# Patient Record
Sex: Male | Born: 1940 | Race: White | Hispanic: No | Marital: Married | Smoking: Never smoker
Health system: Southern US, Community
[De-identification: ages and names within clinical notes are randomized; demographics above are authoritative.]

---

## 2008-05-16 ENCOUNTER — Ambulatory Visit: Payer: Self-pay | Admitting: Gastroenterology

## 2011-03-12 ENCOUNTER — Emergency Department: Payer: Self-pay | Admitting: Emergency Medicine

## 2012-06-16 ENCOUNTER — Ambulatory Visit: Payer: Self-pay | Admitting: Orthopedic Surgery

## 2012-06-20 ENCOUNTER — Ambulatory Visit: Payer: Self-pay | Admitting: Orthopedic Surgery

## 2013-06-19 ENCOUNTER — Ambulatory Visit: Payer: Self-pay | Admitting: Gastroenterology

## 2013-06-20 LAB — PATHOLOGY REPORT

## 2016-07-05 ENCOUNTER — Ambulatory Visit (INDEPENDENT_AMBULATORY_CARE_PROVIDER_SITE_OTHER): Payer: Medicare Other

## 2016-07-05 ENCOUNTER — Ambulatory Visit
Admission: EM | Admit: 2016-07-05 | Discharge: 2016-07-05 | Disposition: A | Discharge status: Home / Self Care | Payer: Medicare Other | Attending: Family Medicine | Admitting: Family Medicine

## 2016-07-05 DIAGNOSIS — R059 Cough, unspecified: Secondary | ICD-10-CM

## 2016-07-05 DIAGNOSIS — R05 Cough: Secondary | ICD-10-CM

## 2016-07-05 DIAGNOSIS — J0100 Acute maxillary sinusitis, unspecified: Secondary | ICD-10-CM | POA: Diagnosis not present

## 2016-07-05 DIAGNOSIS — I4891 Unspecified atrial fibrillation: Secondary | ICD-10-CM

## 2016-07-05 DIAGNOSIS — Z136 Encounter for screening for cardiovascular disorders: Secondary | ICD-10-CM

## 2016-07-05 DIAGNOSIS — I499 Cardiac arrhythmia, unspecified: Secondary | ICD-10-CM | POA: Diagnosis present

## 2016-07-05 MED ORDER — ALBUTEROL SULFATE (2.5 MG/3ML) 0.083% IN NEBU
2.5000 mg | INHALATION_SOLUTION | Freq: Once | RESPIRATORY_TRACT | Status: AC
  Administered 2016-07-05: 2.5 mg via RESPIRATORY_TRACT

## 2016-07-05 MED ORDER — ALBUTEROL SULFATE HFA 108 (90 BASE) MCG/ACT IN AERS: 2.0000 | INHALATION_SPRAY | Freq: Four times a day (QID) | RESPIRATORY_TRACT | 0 refills | Status: AC | PRN

## 2016-07-05 MED ORDER — PREDNISONE 20 MG PO TABS: 40.0000 mg | ORAL_TABLET | Freq: Every day | ORAL | 0 refills | Status: AC

## 2016-07-05 MED ORDER — BENZONATATE 100 MG PO CAPS: 100.0000 mg | ORAL_CAPSULE | Freq: Three times a day (TID) | ORAL | 0 refills | Status: AC | PRN

## 2016-07-05 MED ORDER — DOXYCYCLINE HYCLATE 100 MG PO CAPS: 100.0000 mg | ORAL_CAPSULE | Freq: Two times a day (BID) | ORAL | 0 refills | Status: AC

## 2016-07-05 NOTE — ED Triage Notes (Signed)
Pt c/o nasal congestion and a deep cough since Friday. He says it is keeping him from sleeping. Facial pressure and headache.

## 2016-07-05 NOTE — Discharge Instructions (Signed)
Take medication as prescribed. Rest. Drink plenty of fluids.   Follow up tomorrow with cardiology as scheduled and as discussed. This is very important to keep this appointment.  Follow up with your primary care physician this week as needed. Return to Urgent care or ER for any chest pain, shortness of breath, weakness, palpitations, new or worsening concerns.

## 2016-07-05 NOTE — ED Provider Notes (Signed)
[facility] ____________________________________________  Time seen: Approximately 2:57 PM  I have reviewed the triage vital signs and the nursing notes.   HISTORY  Chief Complaint Sinusitis   HPI Jordan Stephenson is a 76 y.o. male presenting for evaluation of 4-5 days of cough and chest congestion. Patient reports occasional production of greenish mucus. Patient further reports he has had sinus drainage, nasal congestion and some sinus pressure that has been going on for approximately 2 weeks and states that he believes his seasonal allergies causes to start. Reports has not been taken over-the-counter medications except for occasional allergy medicines that have not resolved symptoms. Reports he does have a history of similar presentation due to his allergies. Patient reports that yesterday morning he did wake up with his shirt wet, but denies known fevers. Denies any associated chest pain, shortness of breath or chest pain with deep breath. Reports has continued to remain active and has continued to eat and drink well. Denies known sick contacts.  Denies chest pain, shortness of breath, palpitations, abdominal pain, dysuria, extremity pain, extremity swelling or rash. Denies recent sickness. Denies recent antibiotic use. Denies cardiac history. Denies renal insufficiency history.  FELDPAUSCH, DALE E, MD: PCP    past medical history Hypertension Hyperlipidemia BPH Allergies  There are no active problems to display for this patient.   History reviewed. No pertinent surgical history.   No current facility-administered medications for this encounter.   Current Outpatient Prescriptions:  .  bisoprolol-hydrochlorothiazide (ZIAC) 5-6.25 MG tablet, Take 1 tablet by mouth daily., Disp: , Rfl:  .  finasteride (PROSCAR) 5 MG tablet, Take 5 mg by mouth daily., Disp: , Rfl:  .  gabapentin (NEURONTIN) 300 MG capsule, Take 300 mg by mouth as needed., Disp: , Rfl:  .  gemfibrozil  (LOPID) 600 MG tablet, Take 600 mg by mouth 2 (two) times daily before a meal., Disp: , Rfl:  .  loratadine (CLARITIN) 10 MG tablet, Take 10 mg by mouth daily., Disp: , Rfl:  .  tamsulosin (FLOMAX) 0.4 MG CAPS capsule, Take 0.4 mg by mouth., Disp: , Rfl:  .  albuterol (PROVENTIL HFA;VENTOLIN HFA) 108 (90 Base) MCG/ACT inhaler, Inhale 2 puffs into the lungs every 6 (six) hours as needed for wheezing., Disp: 1 Inhaler, Rfl: 0 .  benzonatate (TESSALON PERLES) 100 MG capsule, Take 1 capsule (100 mg total) by mouth 3 (three) times daily as needed., Disp: 15 capsule, Rfl: 0 .  doxycycline (VIBRAMYCIN) 100 MG capsule, Take 1 capsule (100 mg total) by mouth 2 (two) times daily., Disp: 20 capsule, Rfl: 0 .  predniSONE (DELTASONE) 20 MG tablet, Take 2 tablets (40 mg total) by mouth daily., Disp: 10 tablet, Rfl: 0  Allergies Patient has no known allergies.  family history. Father history of hypertension Denies family cardiac history.   Social History Social History  Substance Use Topics  . Smoking status: Never Smoker  . Smokeless tobacco: Never Used  . Alcohol use No    Review of Systems Constitutional: As above. Eyes: No visual changes. ENT: No sore throat. Cardiovascular: Denies chest pain. Respiratory: Denies shortness of breath. Gastrointestinal: No abdominal pain.  No nausea, no vomiting.  No diarrhea.  No constipation. Genitourinary: Negative for dysuria. Musculoskeletal: Negative for back pain. Skin: Negative for rash.   ____________________________________________   PHYSICAL EXAM:  VITAL SIGNS: ED Triage Vitals  Enc Vitals Group     BP 07/05/16 1355 138/72     Pulse Rate 07/05/16 1355 87  Resp 07/05/16 1355 18     Temp 07/05/16 1355 98.4 F (36.9 C)     Temp Source 07/05/16 1355 Oral     SpO2 07/05/16 1355 97 %     Weight 07/05/16 1356 230 lb (104.3 kg)     Height 07/05/16 1356  (1.753 m)     Head Circumference --      Peak Flow --      Pain Score 07/05/16  1356 1     Pain Loc --      Pain Edu? --      Excl. in GC? --     Constitutional: Alert and oriented. Well appearing and in no acute distress. Eyes: Conjunctivae are normal. PERRL. EOMI. Head: Atraumatic.Mild to moderate tenderness to palpation bilateral maxillary sinuses, nontender frontal sinuses . No swelling. No erythema.   Ears: no erythema, normal TMs bilaterally.   Nose: nasal congestion with bilateral nasal turbinate erythema and edema.   Mouth/Throat: Mucous membranes are moist.  Oropharynx non-erythematous .No tonsillar swelling or exudate.  Posterior nasal drainage present  Neck: No stridor.  No cervical spine tenderness to palpation. Hematological/Lymphatic/Immunilogical: No cervical lymphadenopathy. Cardiovascular: Normal rate. irregular rhythm.  Good peripheral circulation. Respiratory: Normal respiratory effort.  No retractions. Mild scattered inspiratory and expiratory wheezes and mild scattered rhonchi. Rhonchi improves with cough. speaks in complete sentences. Dry intermittent cough noted in room.  Good air movement.  Gastrointestinal: Soft and nontender. No distention.Marland Kitchen No CVA tenderness. Musculoskeletal: Steady gait. No cervical, thoracic or lumbar tenderness to palpation.  bilateral lower extremities nontender no edema noted.  Neurologic:  Normal speech and language. No gait instability. Skin:  Skin is warm, dry Psychiatric: Mood and affect are normal. Speech and behavior are normal.  ___________________________________________   LABS (all labs ordered are listed, but only abnormal results are displayed)  Labs Reviewed - No data to display ____________________________________________  EKG  ED ECG REPORT I, Renford Dills, the attending provider, personally viewed and interpreted this ECG.   Date: 07/05/2016  EKG Time: 1517  Rate: 96  Rhythm: atrial fibrillation, rate 96  Axis: normal  Intervals:none  ST&T Change: no ST or T wave changes noted.  Previous  EKG from 2012 noted, has normal sinus rhythm. EKG visible of dictated report only from the [facility] 2017 noted to be sinus bradycardia. No previous documented history of A. fib noted.  RADIOLOGY   Dg Chest 2 View  Result Date: 07/05/2016 CLINICAL DATA:  Productive cough, wheezing, and congestion. EXAM: CHEST  2 VIEW COMPARISON:  03/12/2011 FINDINGS: The cardiac silhouette remains mildly enlarged. The lungs are well inflated and clear. No pleural effusion or pneumothorax is identified. Thoracic spondylosis is noted. IMPRESSION: No active cardiopulmonary disease. Electronically Signed   By: Sebastian Ache M.D.   On: 07/05/2016 15:50   ____________________________________________   PROCEDURES Procedures    INITIAL IMPRESSION / ASSESSMENT AND PLAN / ED COURSE  Pertinent labs & imaging results that were available during my care of the patient were reviewed by me and considered in my medical decision making (see chart for details).  Very well-appearing patient. No acute distress. Patient smiling and laughing during exam and interaction. Patient patient does have some wheezing and rhonchi noted. Suspect sinusitis secondary to postnasal drainage and cough. Discussed the patient suspect bronchial inflammation and will evaluate by chest x-ray to exclude pneumonia. Albuterol med administered. Patient noted to have a regular heartbeat during exam, patient denies any previous history of this. Recommend EKG, patient agreed to  plan.   Chest x-ray per radiologist no active cardiopulmonary disease. After albuterol neb in urgent care, wheezes fully resolved and patient reports cough improved and feeling better. Will treat patient with oral prednisone, when necessary albuterol inhaler, doxycycline and when necessary Tessalon Perles. EKG reviewed and suspected is A. fib. Discussed with patient suspect incidental finding. Patient appears well, declines any chest pain, shortness breath, palpitations, dizziness, weakness  and appears rate controlled. Called and discussed with cardiologist Dr. Gwen Pounds on call who reviewed EKG which appears as A. fib. Discussed with cardiologist, cardiologist did not recommend any further intervention at this time in urgent care and he will see patient in his office tomorrow at 2:30 PM. Patient and wife at bedside verbalized understanding as well as importance of this follow-up and agrees to keep appointment with cardiologist tomorrow. Discussed in detail with patient for any change in current status, worsening concerns, chest pain, shortness of breath or palpitations to immediately proceed to the emergency room. Encourage close primary care follow-up.Discussed indication, risks and benefits of medications with patient.  Discussed follow up with Primary care physician this week. Discussed follow up and return parameters including no resolution or any worsening concerns. Patient verbalized understanding and agreed to plan.   ____________________________________________   FINAL CLINICAL IMPRESSION(S) / ED DIAGNOSES  Final diagnoses:  Acute maxillary sinusitis, recurrence not specified  Cough  Atrial fibrillation, unspecified type [facility])     Discharge Medication List as of 07/05/2016  4:16 PM    START taking these medications   Details  albuterol (PROVENTIL HFA;VENTOLIN HFA) 108 (90 Base) MCG/ACT inhaler Inhale 2 puffs into the lungs every 6 (six) hours as needed for wheezing., Starting Mon 07/05/2016, Normal    benzonatate (TESSALON PERLES) 100 MG capsule Take 1 capsule (100 mg total) by mouth 3 (three) times daily as needed., Starting Mon 07/05/2016, Normal    doxycycline (VIBRAMYCIN) 100 MG capsule Take 1 capsule (100 mg total) by mouth 2 (two) times daily., Starting Mon 07/05/2016, Normal    predniSONE (DELTASONE) 20 MG tablet Take 2 tablets (40 mg total) by mouth daily., Starting Mon 07/05/2016, Normal        Note: This dictation was prepared with Dragon dictation along with  smaller phrase technology. Any transcriptional errors that result from this process are unintentional.         Renford Dills, NP 07/05/16 1953

## 2017-12-11 IMAGING — CR DG CHEST 2V
2 series · 2 of 2 positions shown · non-contrast
Comparison: 03/12/2011

CLINICAL DATA: Productive cough, wheezing, and congestion.

EXAM:
CHEST  2 VIEW

[chest pa]
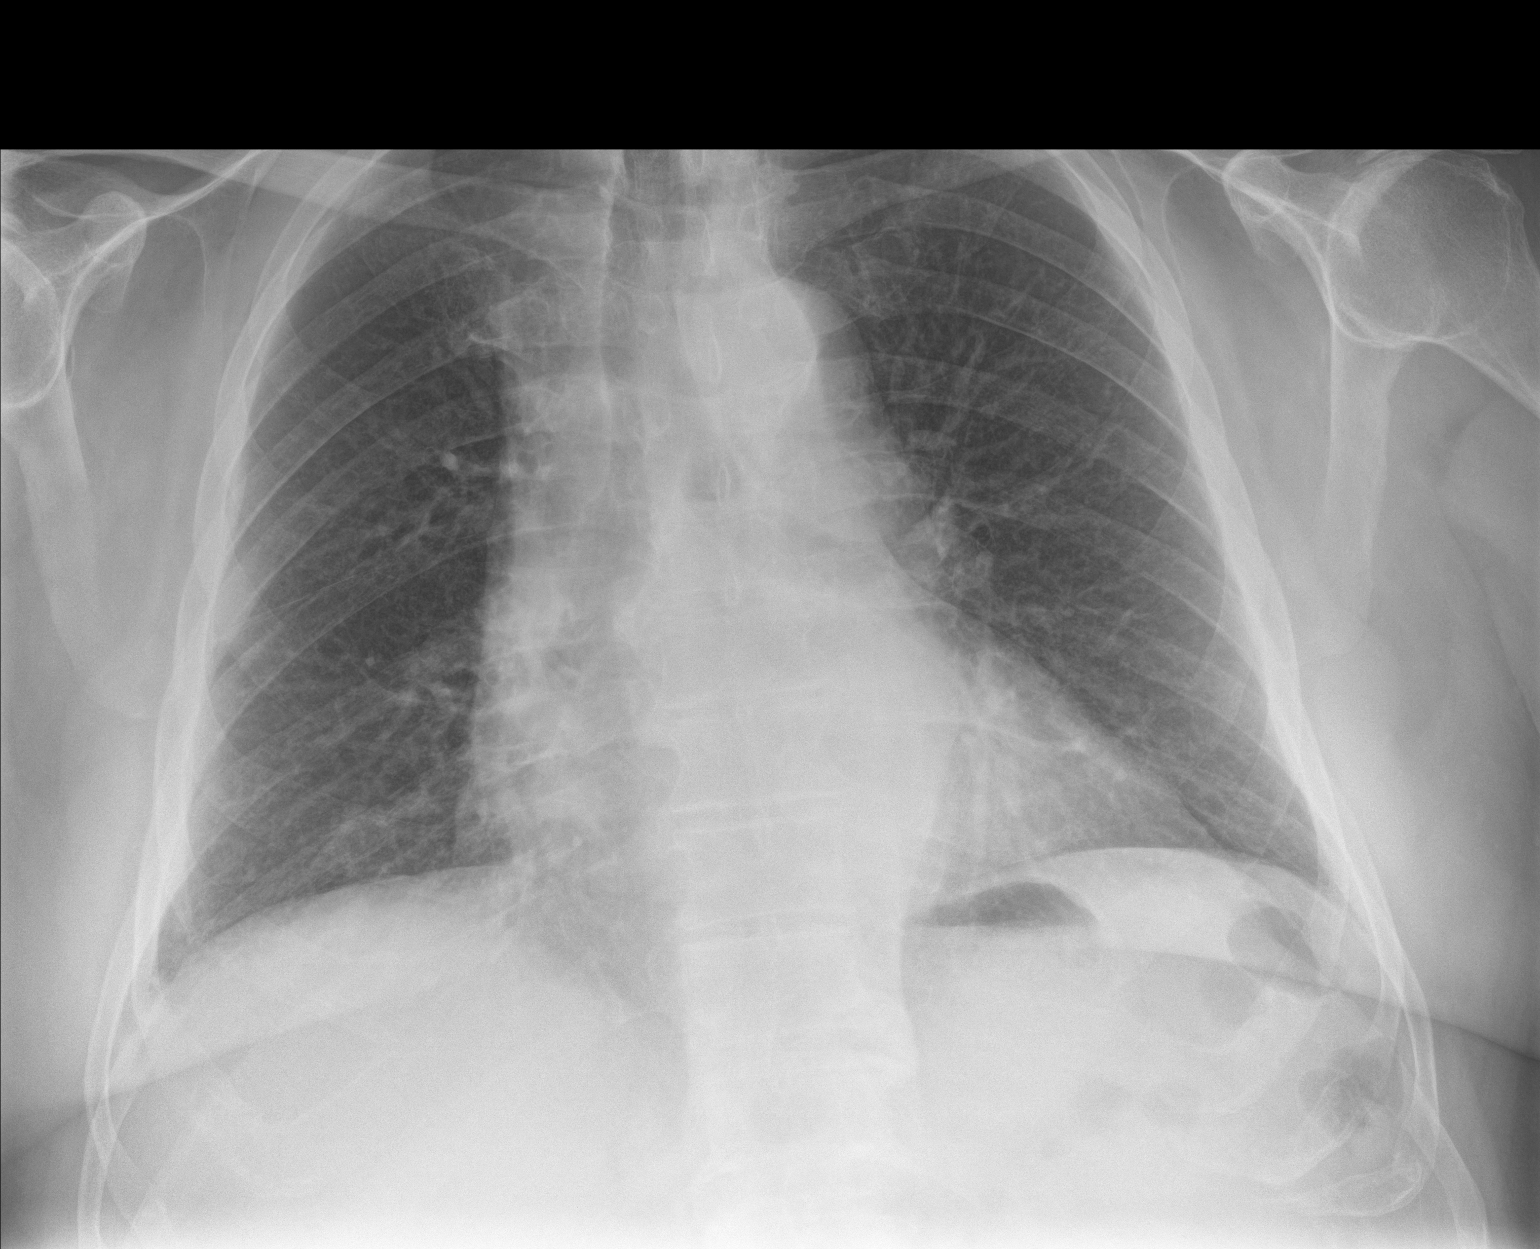

[chest lat]
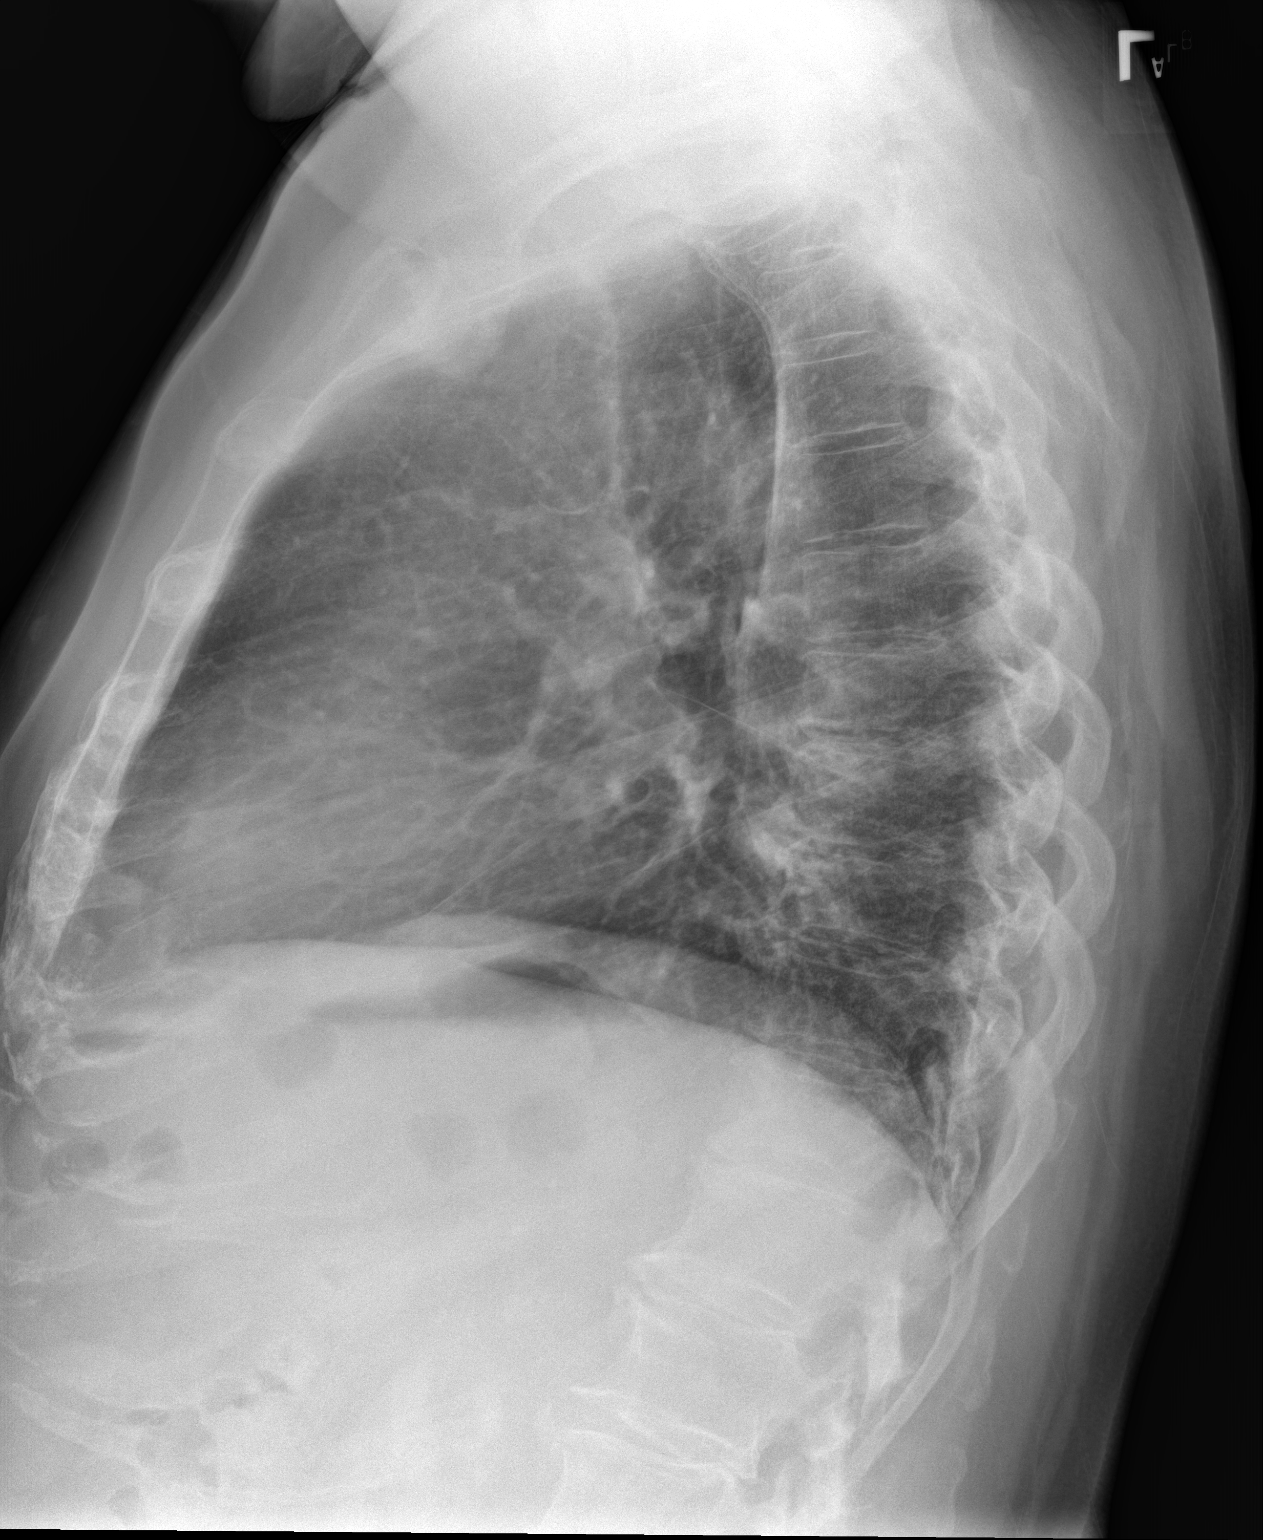

[2 of 2 positions shown; findings below may reference images not displayed]

FINDINGS: The cardiac silhouette remains mildly enlarged. The lungs are well
inflated and clear. No pleural effusion or pneumothorax is
identified. Thoracic spondylosis is noted.
IMPRESSION: No active cardiopulmonary disease.

## 2018-10-02 ENCOUNTER — Other Ambulatory Visit: Admission: RE | Admit: 2018-10-02 | Payer: Medicare Other | Source: Ambulatory Visit

## 2018-10-05 ENCOUNTER — Ambulatory Visit: Admit: 2018-10-05 | Payer: Medicare Other | Admitting: Gastroenterology

## 2018-10-05 SURGERY — COLONOSCOPY WITH PROPOFOL
Anesthesia: General

## 2021-08-14 ENCOUNTER — Ambulatory Visit: Admission: RE | Admit: 2021-08-14 | Payer: Medicare Other | Source: Home / Self Care

## 2021-08-14 ENCOUNTER — Encounter: Admission: RE | Payer: Self-pay | Source: Home / Self Care

## 2021-08-14 SURGERY — COLONOSCOPY WITH PROPOFOL
Anesthesia: General

## 2021-09-24 DIAGNOSIS — M5416 Radiculopathy, lumbar region: Secondary | ICD-10-CM | POA: Diagnosis not present

## 2021-09-24 DIAGNOSIS — M5116 Intervertebral disc disorders with radiculopathy, lumbar region: Secondary | ICD-10-CM | POA: Diagnosis not present

## 2021-09-24 DIAGNOSIS — Z01818 Encounter for other preprocedural examination: Secondary | ICD-10-CM | POA: Diagnosis not present

## 2021-09-24 DIAGNOSIS — M48061 Spinal stenosis, lumbar region without neurogenic claudication: Secondary | ICD-10-CM | POA: Diagnosis not present

## 2021-09-24 DIAGNOSIS — M4316 Spondylolisthesis, lumbar region: Secondary | ICD-10-CM | POA: Diagnosis not present

## 2021-09-24 DIAGNOSIS — I10 Essential (primary) hypertension: Secondary | ICD-10-CM | POA: Diagnosis not present

## 2021-09-24 DIAGNOSIS — M47816 Spondylosis without myelopathy or radiculopathy, lumbar region: Secondary | ICD-10-CM | POA: Diagnosis not present

## 2021-09-24 DIAGNOSIS — M48062 Spinal stenosis, lumbar region with neurogenic claudication: Secondary | ICD-10-CM | POA: Diagnosis not present

## 2021-09-24 DIAGNOSIS — I4891 Unspecified atrial fibrillation: Secondary | ICD-10-CM | POA: Diagnosis not present

## 2021-10-09 DIAGNOSIS — Z7901 Long term (current) use of anticoagulants: Secondary | ICD-10-CM | POA: Diagnosis not present

## 2021-10-09 DIAGNOSIS — M5116 Intervertebral disc disorders with radiculopathy, lumbar region: Secondary | ICD-10-CM | POA: Diagnosis not present

## 2021-10-09 DIAGNOSIS — M5136 Other intervertebral disc degeneration, lumbar region: Secondary | ICD-10-CM | POA: Diagnosis not present

## 2021-10-09 DIAGNOSIS — E7800 Pure hypercholesterolemia, unspecified: Secondary | ICD-10-CM | POA: Diagnosis not present

## 2021-10-09 DIAGNOSIS — M48062 Spinal stenosis, lumbar region with neurogenic claudication: Secondary | ICD-10-CM | POA: Diagnosis not present

## 2021-10-09 DIAGNOSIS — Z981 Arthrodesis status: Secondary | ICD-10-CM | POA: Diagnosis not present

## 2021-10-09 DIAGNOSIS — I10 Essential (primary) hypertension: Secondary | ICD-10-CM | POA: Diagnosis not present

## 2021-10-09 DIAGNOSIS — N400 Enlarged prostate without lower urinary tract symptoms: Secondary | ICD-10-CM | POA: Diagnosis not present

## 2021-10-09 DIAGNOSIS — M4317 Spondylolisthesis, lumbosacral region: Secondary | ICD-10-CM | POA: Diagnosis not present

## 2021-10-09 DIAGNOSIS — M47816 Spondylosis without myelopathy or radiculopathy, lumbar region: Secondary | ICD-10-CM | POA: Diagnosis not present

## 2021-10-09 DIAGNOSIS — I4891 Unspecified atrial fibrillation: Secondary | ICD-10-CM | POA: Diagnosis not present

## 2021-10-09 DIAGNOSIS — M4726 Other spondylosis with radiculopathy, lumbar region: Secondary | ICD-10-CM | POA: Diagnosis not present

## 2021-10-09 DIAGNOSIS — N138 Other obstructive and reflux uropathy: Secondary | ICD-10-CM | POA: Diagnosis not present

## 2021-10-09 DIAGNOSIS — E785 Hyperlipidemia, unspecified: Secondary | ICD-10-CM | POA: Diagnosis not present

## 2021-10-19 DIAGNOSIS — E7800 Pure hypercholesterolemia, unspecified: Secondary | ICD-10-CM | POA: Diagnosis not present

## 2021-10-19 DIAGNOSIS — M48061 Spinal stenosis, lumbar region without neurogenic claudication: Secondary | ICD-10-CM | POA: Diagnosis not present

## 2021-10-19 DIAGNOSIS — Z4789 Encounter for other orthopedic aftercare: Secondary | ICD-10-CM | POA: Diagnosis not present

## 2021-10-19 DIAGNOSIS — I480 Paroxysmal atrial fibrillation: Secondary | ICD-10-CM | POA: Diagnosis not present

## 2021-10-19 DIAGNOSIS — J989 Respiratory disorder, unspecified: Secondary | ICD-10-CM | POA: Diagnosis not present

## 2021-10-19 DIAGNOSIS — N401 Enlarged prostate with lower urinary tract symptoms: Secondary | ICD-10-CM | POA: Diagnosis not present

## 2021-10-19 DIAGNOSIS — N138 Other obstructive and reflux uropathy: Secondary | ICD-10-CM | POA: Diagnosis not present

## 2021-10-19 DIAGNOSIS — R7303 Prediabetes: Secondary | ICD-10-CM | POA: Diagnosis not present

## 2021-10-19 DIAGNOSIS — I10 Essential (primary) hypertension: Secondary | ICD-10-CM | POA: Diagnosis not present

## 2021-10-29 DIAGNOSIS — M5416 Radiculopathy, lumbar region: Secondary | ICD-10-CM | POA: Diagnosis not present

## 2021-10-29 DIAGNOSIS — Z981 Arthrodesis status: Secondary | ICD-10-CM | POA: Diagnosis not present

## 2021-10-29 DIAGNOSIS — M5116 Intervertebral disc disorders with radiculopathy, lumbar region: Secondary | ICD-10-CM | POA: Diagnosis not present

## 2021-10-29 DIAGNOSIS — M4726 Other spondylosis with radiculopathy, lumbar region: Secondary | ICD-10-CM | POA: Diagnosis not present

## 2021-10-29 DIAGNOSIS — M47816 Spondylosis without myelopathy or radiculopathy, lumbar region: Secondary | ICD-10-CM | POA: Diagnosis not present

## 2021-10-29 DIAGNOSIS — M48062 Spinal stenosis, lumbar region with neurogenic claudication: Secondary | ICD-10-CM | POA: Diagnosis not present

## 2021-11-11 ENCOUNTER — Ambulatory Visit: Admit: 2021-11-11 | Payer: Self-pay | Admitting: Internal Medicine

## 2021-11-11 SURGERY — COLONOSCOPY WITH PROPOFOL
Anesthesia: General

## 2021-11-26 DIAGNOSIS — M5136 Other intervertebral disc degeneration, lumbar region: Secondary | ICD-10-CM | POA: Diagnosis not present

## 2021-11-26 DIAGNOSIS — M47816 Spondylosis without myelopathy or radiculopathy, lumbar region: Secondary | ICD-10-CM | POA: Diagnosis not present

## 2021-12-04 DIAGNOSIS — H524 Presbyopia: Secondary | ICD-10-CM | POA: Diagnosis not present

## 2021-12-10 DIAGNOSIS — E7800 Pure hypercholesterolemia, unspecified: Secondary | ICD-10-CM | POA: Diagnosis not present

## 2021-12-10 DIAGNOSIS — M5441 Lumbago with sciatica, right side: Secondary | ICD-10-CM | POA: Diagnosis not present

## 2021-12-10 DIAGNOSIS — E669 Obesity, unspecified: Secondary | ICD-10-CM | POA: Diagnosis not present

## 2021-12-10 DIAGNOSIS — J301 Allergic rhinitis due to pollen: Secondary | ICD-10-CM | POA: Diagnosis not present

## 2021-12-10 DIAGNOSIS — I480 Paroxysmal atrial fibrillation: Secondary | ICD-10-CM | POA: Diagnosis not present

## 2021-12-10 DIAGNOSIS — I10 Essential (primary) hypertension: Secondary | ICD-10-CM | POA: Diagnosis not present

## 2021-12-10 DIAGNOSIS — E119 Type 2 diabetes mellitus without complications: Secondary | ICD-10-CM | POA: Diagnosis not present

## 2021-12-10 DIAGNOSIS — G8929 Other chronic pain: Secondary | ICD-10-CM | POA: Diagnosis not present

## 2021-12-10 DIAGNOSIS — N401 Enlarged prostate with lower urinary tract symptoms: Secondary | ICD-10-CM | POA: Diagnosis not present

## 2021-12-11 DIAGNOSIS — E7800 Pure hypercholesterolemia, unspecified: Secondary | ICD-10-CM | POA: Diagnosis not present

## 2021-12-11 DIAGNOSIS — E119 Type 2 diabetes mellitus without complications: Secondary | ICD-10-CM | POA: Diagnosis not present

## 2021-12-18 DIAGNOSIS — H2513 Age-related nuclear cataract, bilateral: Secondary | ICD-10-CM | POA: Diagnosis not present

## 2021-12-18 DIAGNOSIS — H40003 Preglaucoma, unspecified, bilateral: Secondary | ICD-10-CM | POA: Diagnosis not present

## 2022-01-05 DIAGNOSIS — Z4889 Encounter for other specified surgical aftercare: Secondary | ICD-10-CM | POA: Diagnosis not present

## 2022-01-05 DIAGNOSIS — M47816 Spondylosis without myelopathy or radiculopathy, lumbar region: Secondary | ICD-10-CM | POA: Diagnosis not present

## 2022-01-07 DIAGNOSIS — E7800 Pure hypercholesterolemia, unspecified: Secondary | ICD-10-CM | POA: Diagnosis not present

## 2022-01-07 DIAGNOSIS — N138 Other obstructive and reflux uropathy: Secondary | ICD-10-CM | POA: Diagnosis not present

## 2022-01-07 DIAGNOSIS — Z885 Allergy status to narcotic agent status: Secondary | ICD-10-CM | POA: Diagnosis not present

## 2022-01-07 DIAGNOSIS — N401 Enlarged prostate with lower urinary tract symptoms: Secondary | ICD-10-CM | POA: Diagnosis not present

## 2022-01-07 DIAGNOSIS — Z7901 Long term (current) use of anticoagulants: Secondary | ICD-10-CM | POA: Diagnosis not present

## 2022-01-07 DIAGNOSIS — I10 Essential (primary) hypertension: Secondary | ICD-10-CM | POA: Diagnosis not present

## 2022-01-07 DIAGNOSIS — Z79899 Other long term (current) drug therapy: Secondary | ICD-10-CM | POA: Diagnosis not present

## 2022-02-01 DIAGNOSIS — D229 Melanocytic nevi, unspecified: Secondary | ICD-10-CM | POA: Diagnosis not present

## 2022-02-01 DIAGNOSIS — L570 Actinic keratosis: Secondary | ICD-10-CM | POA: Diagnosis not present

## 2022-02-01 DIAGNOSIS — L578 Other skin changes due to chronic exposure to nonionizing radiation: Secondary | ICD-10-CM | POA: Diagnosis not present

## 2022-02-01 DIAGNOSIS — L820 Inflamed seborrheic keratosis: Secondary | ICD-10-CM | POA: Diagnosis not present

## 2022-02-01 DIAGNOSIS — L814 Other melanin hyperpigmentation: Secondary | ICD-10-CM | POA: Diagnosis not present

## 2022-02-02 DIAGNOSIS — H40003 Preglaucoma, unspecified, bilateral: Secondary | ICD-10-CM | POA: Diagnosis not present

## 2022-02-02 DIAGNOSIS — H2513 Age-related nuclear cataract, bilateral: Secondary | ICD-10-CM | POA: Diagnosis not present

## 2022-04-06 DIAGNOSIS — M79604 Pain in right leg: Secondary | ICD-10-CM | POA: Diagnosis not present

## 2022-04-06 DIAGNOSIS — M47816 Spondylosis without myelopathy or radiculopathy, lumbar region: Secondary | ICD-10-CM | POA: Diagnosis not present

## 2022-04-06 DIAGNOSIS — I10 Essential (primary) hypertension: Secondary | ICD-10-CM | POA: Diagnosis not present

## 2022-04-06 DIAGNOSIS — M170 Bilateral primary osteoarthritis of knee: Secondary | ICD-10-CM | POA: Diagnosis not present

## 2022-04-06 DIAGNOSIS — I4891 Unspecified atrial fibrillation: Secondary | ICD-10-CM | POA: Diagnosis not present

## 2022-04-06 DIAGNOSIS — Z4789 Encounter for other orthopedic aftercare: Secondary | ICD-10-CM | POA: Diagnosis not present

## 2022-04-06 DIAGNOSIS — M48061 Spinal stenosis, lumbar region without neurogenic claudication: Secondary | ICD-10-CM | POA: Diagnosis not present

## 2022-04-06 DIAGNOSIS — M4317 Spondylolisthesis, lumbosacral region: Secondary | ICD-10-CM | POA: Diagnosis not present

## 2022-04-06 DIAGNOSIS — M5416 Radiculopathy, lumbar region: Secondary | ICD-10-CM | POA: Diagnosis not present

## 2022-04-06 DIAGNOSIS — M4316 Spondylolisthesis, lumbar region: Secondary | ICD-10-CM | POA: Diagnosis not present

## 2022-04-06 DIAGNOSIS — I4819 Other persistent atrial fibrillation: Secondary | ICD-10-CM | POA: Diagnosis not present

## 2022-04-06 DIAGNOSIS — Z981 Arthrodesis status: Secondary | ICD-10-CM | POA: Diagnosis not present

## 2022-04-06 DIAGNOSIS — M7989 Other specified soft tissue disorders: Secondary | ICD-10-CM | POA: Diagnosis not present

## 2022-04-12 DIAGNOSIS — M25562 Pain in left knee: Secondary | ICD-10-CM | POA: Diagnosis not present

## 2022-04-15 DIAGNOSIS — M170 Bilateral primary osteoarthritis of knee: Secondary | ICD-10-CM | POA: Diagnosis not present

## 2022-04-15 DIAGNOSIS — S8002XA Contusion of left knee, initial encounter: Secondary | ICD-10-CM | POA: Diagnosis not present

## 2022-05-10 DIAGNOSIS — M1712 Unilateral primary osteoarthritis, left knee: Secondary | ICD-10-CM | POA: Diagnosis not present

## 2022-06-09 DIAGNOSIS — E7800 Pure hypercholesterolemia, unspecified: Secondary | ICD-10-CM | POA: Diagnosis not present

## 2022-06-09 DIAGNOSIS — E119 Type 2 diabetes mellitus without complications: Secondary | ICD-10-CM | POA: Diagnosis not present

## 2022-06-09 DIAGNOSIS — I10 Essential (primary) hypertension: Secondary | ICD-10-CM | POA: Diagnosis not present

## 2022-06-09 DIAGNOSIS — I480 Paroxysmal atrial fibrillation: Secondary | ICD-10-CM | POA: Diagnosis not present

## 2022-06-18 DIAGNOSIS — H9190 Unspecified hearing loss, unspecified ear: Secondary | ICD-10-CM | POA: Diagnosis not present

## 2022-06-29 DIAGNOSIS — H2513 Age-related nuclear cataract, bilateral: Secondary | ICD-10-CM | POA: Diagnosis not present

## 2022-07-06 DIAGNOSIS — M47816 Spondylosis without myelopathy or radiculopathy, lumbar region: Secondary | ICD-10-CM | POA: Diagnosis not present

## 2022-07-06 DIAGNOSIS — I4891 Unspecified atrial fibrillation: Secondary | ICD-10-CM | POA: Diagnosis not present

## 2022-07-06 DIAGNOSIS — Z7901 Long term (current) use of anticoagulants: Secondary | ICD-10-CM | POA: Diagnosis not present

## 2022-07-06 DIAGNOSIS — Z981 Arthrodesis status: Secondary | ICD-10-CM | POA: Diagnosis not present

## 2022-07-06 DIAGNOSIS — I10 Essential (primary) hypertension: Secondary | ICD-10-CM | POA: Diagnosis not present

## 2022-07-06 DIAGNOSIS — M5136 Other intervertebral disc degeneration, lumbar region: Secondary | ICD-10-CM | POA: Diagnosis not present

## 2022-07-19 DIAGNOSIS — L570 Actinic keratosis: Secondary | ICD-10-CM | POA: Diagnosis not present

## 2022-07-19 DIAGNOSIS — L578 Other skin changes due to chronic exposure to nonionizing radiation: Secondary | ICD-10-CM | POA: Diagnosis not present

## 2022-07-19 DIAGNOSIS — L814 Other melanin hyperpigmentation: Secondary | ICD-10-CM | POA: Diagnosis not present

## 2022-07-19 DIAGNOSIS — Z85828 Personal history of other malignant neoplasm of skin: Secondary | ICD-10-CM | POA: Diagnosis not present

## 2022-07-19 DIAGNOSIS — D229 Melanocytic nevi, unspecified: Secondary | ICD-10-CM | POA: Diagnosis not present

## 2022-07-20 DIAGNOSIS — E119 Type 2 diabetes mellitus without complications: Secondary | ICD-10-CM | POA: Diagnosis not present

## 2022-07-28 DIAGNOSIS — I10 Essential (primary) hypertension: Secondary | ICD-10-CM | POA: Diagnosis not present

## 2022-07-28 DIAGNOSIS — N401 Enlarged prostate with lower urinary tract symptoms: Secondary | ICD-10-CM | POA: Diagnosis not present

## 2022-07-28 DIAGNOSIS — Z0000 Encounter for general adult medical examination without abnormal findings: Secondary | ICD-10-CM | POA: Diagnosis not present

## 2022-07-28 DIAGNOSIS — J301 Allergic rhinitis due to pollen: Secondary | ICD-10-CM | POA: Diagnosis not present

## 2022-07-28 DIAGNOSIS — E7800 Pure hypercholesterolemia, unspecified: Secondary | ICD-10-CM | POA: Diagnosis not present

## 2022-07-28 DIAGNOSIS — Z1331 Encounter for screening for depression: Secondary | ICD-10-CM | POA: Diagnosis not present

## 2022-07-28 DIAGNOSIS — M5442 Lumbago with sciatica, left side: Secondary | ICD-10-CM | POA: Diagnosis not present

## 2022-07-28 DIAGNOSIS — I480 Paroxysmal atrial fibrillation: Secondary | ICD-10-CM | POA: Diagnosis not present

## 2022-07-28 DIAGNOSIS — E119 Type 2 diabetes mellitus without complications: Secondary | ICD-10-CM | POA: Diagnosis not present

## 2022-07-29 DIAGNOSIS — Z9889 Other specified postprocedural states: Secondary | ICD-10-CM | POA: Diagnosis not present

## 2022-07-29 DIAGNOSIS — M5136 Other intervertebral disc degeneration, lumbar region: Secondary | ICD-10-CM | POA: Diagnosis not present

## 2022-07-29 DIAGNOSIS — M47816 Spondylosis without myelopathy or radiculopathy, lumbar region: Secondary | ICD-10-CM | POA: Diagnosis not present

## 2022-07-29 DIAGNOSIS — Z981 Arthrodesis status: Secondary | ICD-10-CM | POA: Diagnosis not present

## 2022-07-29 DIAGNOSIS — M48061 Spinal stenosis, lumbar region without neurogenic claudication: Secondary | ICD-10-CM | POA: Diagnosis not present

## 2022-08-03 DIAGNOSIS — M47816 Spondylosis without myelopathy or radiculopathy, lumbar region: Secondary | ICD-10-CM | POA: Diagnosis not present

## 2022-09-01 DIAGNOSIS — H903 Sensorineural hearing loss, bilateral: Secondary | ICD-10-CM | POA: Diagnosis not present

## 2022-09-10 DIAGNOSIS — M47816 Spondylosis without myelopathy or radiculopathy, lumbar region: Secondary | ICD-10-CM | POA: Diagnosis not present

## 2022-09-10 DIAGNOSIS — M5416 Radiculopathy, lumbar region: Secondary | ICD-10-CM | POA: Diagnosis not present

## 2022-09-20 DIAGNOSIS — M5416 Radiculopathy, lumbar region: Secondary | ICD-10-CM | POA: Diagnosis not present

## 2022-09-20 DIAGNOSIS — M4726 Other spondylosis with radiculopathy, lumbar region: Secondary | ICD-10-CM | POA: Diagnosis not present

## 2022-09-20 DIAGNOSIS — Z91041 Radiographic dye allergy status: Secondary | ICD-10-CM | POA: Diagnosis not present

## 2022-09-20 DIAGNOSIS — M5116 Intervertebral disc disorders with radiculopathy, lumbar region: Secondary | ICD-10-CM | POA: Diagnosis not present

## 2022-09-20 DIAGNOSIS — M48061 Spinal stenosis, lumbar region without neurogenic claudication: Secondary | ICD-10-CM | POA: Diagnosis not present

## 2022-09-30 DIAGNOSIS — M5416 Radiculopathy, lumbar region: Secondary | ICD-10-CM | POA: Diagnosis not present

## 2022-11-02 DIAGNOSIS — M545 Low back pain, unspecified: Secondary | ICD-10-CM | POA: Diagnosis not present

## 2022-11-02 DIAGNOSIS — M47816 Spondylosis without myelopathy or radiculopathy, lumbar region: Secondary | ICD-10-CM | POA: Diagnosis not present

## 2022-11-02 DIAGNOSIS — Z7901 Long term (current) use of anticoagulants: Secondary | ICD-10-CM | POA: Diagnosis not present

## 2022-11-02 DIAGNOSIS — Z981 Arthrodesis status: Secondary | ICD-10-CM | POA: Diagnosis not present

## 2022-11-02 DIAGNOSIS — M4316 Spondylolisthesis, lumbar region: Secondary | ICD-10-CM | POA: Diagnosis not present

## 2022-11-02 DIAGNOSIS — M5126 Other intervertebral disc displacement, lumbar region: Secondary | ICD-10-CM | POA: Diagnosis not present

## 2022-11-02 DIAGNOSIS — I4891 Unspecified atrial fibrillation: Secondary | ICD-10-CM | POA: Diagnosis not present

## 2023-06-17 DIAGNOSIS — G9340 Encephalopathy, unspecified: Secondary | ICD-10-CM | POA: Diagnosis not present

## 2023-06-17 DIAGNOSIS — F32A Depression, unspecified: Secondary | ICD-10-CM | POA: Diagnosis not present

## 2023-06-17 DIAGNOSIS — E785 Hyperlipidemia, unspecified: Secondary | ICD-10-CM | POA: Diagnosis not present

## 2023-06-17 DIAGNOSIS — Z7901 Long term (current) use of anticoagulants: Secondary | ICD-10-CM | POA: Diagnosis not present

## 2023-06-17 DIAGNOSIS — R410 Disorientation, unspecified: Secondary | ICD-10-CM | POA: Diagnosis not present

## 2023-06-17 DIAGNOSIS — R4189 Other symptoms and signs involving cognitive functions and awareness: Secondary | ICD-10-CM | POA: Diagnosis not present

## 2023-06-17 DIAGNOSIS — F419 Anxiety disorder, unspecified: Secondary | ICD-10-CM | POA: Diagnosis not present

## 2023-06-17 DIAGNOSIS — R29898 Other symptoms and signs involving the musculoskeletal system: Secondary | ICD-10-CM | POA: Diagnosis not present

## 2023-06-17 DIAGNOSIS — H903 Sensorineural hearing loss, bilateral: Secondary | ICD-10-CM | POA: Diagnosis not present

## 2023-06-17 DIAGNOSIS — I10 Essential (primary) hypertension: Secondary | ICD-10-CM | POA: Diagnosis not present

## 2023-08-04 DIAGNOSIS — N401 Enlarged prostate with lower urinary tract symptoms: Secondary | ICD-10-CM | POA: Diagnosis not present

## 2023-08-04 DIAGNOSIS — I480 Paroxysmal atrial fibrillation: Secondary | ICD-10-CM | POA: Diagnosis not present

## 2023-08-04 DIAGNOSIS — R7302 Impaired glucose tolerance (oral): Secondary | ICD-10-CM | POA: Diagnosis not present

## 2023-08-04 DIAGNOSIS — I10 Essential (primary) hypertension: Secondary | ICD-10-CM | POA: Diagnosis not present

## 2023-08-04 DIAGNOSIS — Z0000 Encounter for general adult medical examination without abnormal findings: Secondary | ICD-10-CM | POA: Diagnosis not present

## 2023-08-04 DIAGNOSIS — E538 Deficiency of other specified B group vitamins: Secondary | ICD-10-CM | POA: Diagnosis not present

## 2023-08-04 DIAGNOSIS — J301 Allergic rhinitis due to pollen: Secondary | ICD-10-CM | POA: Diagnosis not present

## 2023-08-04 DIAGNOSIS — F320 Major depressive disorder, single episode, mild: Secondary | ICD-10-CM | POA: Diagnosis not present

## 2023-08-04 DIAGNOSIS — Z1331 Encounter for screening for depression: Secondary | ICD-10-CM | POA: Diagnosis not present

## 2023-08-04 DIAGNOSIS — E7800 Pure hypercholesterolemia, unspecified: Secondary | ICD-10-CM | POA: Diagnosis not present

## 2023-08-30 DIAGNOSIS — H40003 Preglaucoma, unspecified, bilateral: Secondary | ICD-10-CM | POA: Diagnosis not present

## 2023-08-30 DIAGNOSIS — H2513 Age-related nuclear cataract, bilateral: Secondary | ICD-10-CM | POA: Diagnosis not present

## 2023-09-06 DIAGNOSIS — I4891 Unspecified atrial fibrillation: Secondary | ICD-10-CM | POA: Diagnosis not present

## 2023-09-06 DIAGNOSIS — I10 Essential (primary) hypertension: Secondary | ICD-10-CM | POA: Diagnosis not present

## 2023-09-06 DIAGNOSIS — I4819 Other persistent atrial fibrillation: Secondary | ICD-10-CM | POA: Diagnosis not present

## 2023-09-22 DIAGNOSIS — I4819 Other persistent atrial fibrillation: Secondary | ICD-10-CM | POA: Diagnosis not present

## 2023-10-10 DIAGNOSIS — F321 Major depressive disorder, single episode, moderate: Secondary | ICD-10-CM | POA: Diagnosis not present

## 2023-10-10 DIAGNOSIS — R4189 Other symptoms and signs involving cognitive functions and awareness: Secondary | ICD-10-CM | POA: Diagnosis not present

## 2023-10-10 DIAGNOSIS — D519 Vitamin B12 deficiency anemia, unspecified: Secondary | ICD-10-CM | POA: Diagnosis not present

## 2023-10-10 DIAGNOSIS — G3184 Mild cognitive impairment, so stated: Secondary | ICD-10-CM | POA: Diagnosis not present

## 2023-10-19 DIAGNOSIS — E7800 Pure hypercholesterolemia, unspecified: Secondary | ICD-10-CM | POA: Diagnosis not present

## 2023-10-19 DIAGNOSIS — Z125 Encounter for screening for malignant neoplasm of prostate: Secondary | ICD-10-CM | POA: Diagnosis not present

## 2023-10-19 DIAGNOSIS — N3941 Urge incontinence: Secondary | ICD-10-CM | POA: Diagnosis not present

## 2023-10-19 DIAGNOSIS — I10 Essential (primary) hypertension: Secondary | ICD-10-CM | POA: Diagnosis not present

## 2023-10-19 DIAGNOSIS — Z885 Allergy status to narcotic agent status: Secondary | ICD-10-CM | POA: Diagnosis not present

## 2023-10-19 DIAGNOSIS — K5900 Constipation, unspecified: Secondary | ICD-10-CM | POA: Diagnosis not present

## 2023-10-19 DIAGNOSIS — R413 Other amnesia: Secondary | ICD-10-CM | POA: Diagnosis not present

## 2023-10-19 DIAGNOSIS — N138 Other obstructive and reflux uropathy: Secondary | ICD-10-CM | POA: Diagnosis not present

## 2023-10-19 DIAGNOSIS — N401 Enlarged prostate with lower urinary tract symptoms: Secondary | ICD-10-CM | POA: Diagnosis not present

## 2023-10-19 DIAGNOSIS — D510 Vitamin B12 deficiency anemia due to intrinsic factor deficiency: Secondary | ICD-10-CM | POA: Diagnosis not present

## 2023-10-24 DIAGNOSIS — G9340 Encephalopathy, unspecified: Secondary | ICD-10-CM | POA: Diagnosis not present

## 2023-10-24 DIAGNOSIS — M549 Dorsalgia, unspecified: Secondary | ICD-10-CM | POA: Diagnosis not present

## 2023-10-24 DIAGNOSIS — G8929 Other chronic pain: Secondary | ICD-10-CM | POA: Diagnosis not present

## 2023-10-24 DIAGNOSIS — F329 Major depressive disorder, single episode, unspecified: Secondary | ICD-10-CM | POA: Diagnosis not present

## 2023-10-24 DIAGNOSIS — Z9889 Other specified postprocedural states: Secondary | ICD-10-CM | POA: Diagnosis not present

## 2023-10-24 DIAGNOSIS — R2689 Other abnormalities of gait and mobility: Secondary | ICD-10-CM | POA: Diagnosis not present

## 2023-10-24 DIAGNOSIS — G3184 Mild cognitive impairment, so stated: Secondary | ICD-10-CM | POA: Diagnosis not present

## 2023-10-24 DIAGNOSIS — R260 Ataxic gait: Secondary | ICD-10-CM | POA: Diagnosis not present

## 2023-10-24 DIAGNOSIS — G3183 Dementia with Lewy bodies: Secondary | ICD-10-CM | POA: Diagnosis not present

## 2023-10-24 DIAGNOSIS — G20C Parkinsonism, unspecified: Secondary | ICD-10-CM | POA: Diagnosis not present

## 2023-10-24 DIAGNOSIS — R2681 Unsteadiness on feet: Secondary | ICD-10-CM | POA: Diagnosis not present

## 2023-10-24 DIAGNOSIS — F0280 Dementia in other diseases classified elsewhere without behavioral disturbance: Secondary | ICD-10-CM | POA: Diagnosis not present

## 2023-12-06 DIAGNOSIS — Z66 Do not resuscitate: Secondary | ICD-10-CM | POA: Diagnosis not present

## 2023-12-06 DIAGNOSIS — R531 Weakness: Secondary | ICD-10-CM | POA: Diagnosis not present

## 2023-12-06 DIAGNOSIS — R32 Unspecified urinary incontinence: Secondary | ICD-10-CM | POA: Diagnosis not present

## 2023-12-06 DIAGNOSIS — Z7189 Other specified counseling: Secondary | ICD-10-CM | POA: Diagnosis not present

## 2023-12-06 DIAGNOSIS — I4891 Unspecified atrial fibrillation: Secondary | ICD-10-CM | POA: Diagnosis not present

## 2023-12-06 DIAGNOSIS — R4181 Age-related cognitive decline: Secondary | ICD-10-CM | POA: Diagnosis not present

## 2023-12-06 DIAGNOSIS — R399 Unspecified symptoms and signs involving the genitourinary system: Secondary | ICD-10-CM | POA: Diagnosis not present

## 2023-12-07 DIAGNOSIS — I10 Essential (primary) hypertension: Secondary | ICD-10-CM | POA: Diagnosis not present

## 2023-12-07 DIAGNOSIS — I4891 Unspecified atrial fibrillation: Secondary | ICD-10-CM | POA: Diagnosis not present

## 2023-12-07 DIAGNOSIS — G9349 Other encephalopathy: Secondary | ICD-10-CM | POA: Diagnosis not present

## 2023-12-07 DIAGNOSIS — F0280 Dementia in other diseases classified elsewhere without behavioral disturbance: Secondary | ICD-10-CM | POA: Diagnosis not present

## 2023-12-07 DIAGNOSIS — E43 Unspecified severe protein-calorie malnutrition: Secondary | ICD-10-CM | POA: Diagnosis not present

## 2023-12-07 DIAGNOSIS — G3183 Dementia with Lewy bodies: Secondary | ICD-10-CM | POA: Diagnosis not present

## 2023-12-07 DIAGNOSIS — Z66 Do not resuscitate: Secondary | ICD-10-CM | POA: Diagnosis not present

## 2023-12-07 DIAGNOSIS — R634 Abnormal weight loss: Secondary | ICD-10-CM | POA: Diagnosis not present

## 2023-12-07 DIAGNOSIS — D6859 Other primary thrombophilia: Secondary | ICD-10-CM | POA: Diagnosis not present

## 2023-12-07 DIAGNOSIS — G9340 Encephalopathy, unspecified: Secondary | ICD-10-CM | POA: Diagnosis not present

## 2023-12-07 DIAGNOSIS — J811 Chronic pulmonary edema: Secondary | ICD-10-CM | POA: Diagnosis not present

## 2023-12-07 DIAGNOSIS — F02C4 Dementia in other diseases classified elsewhere, severe, with anxiety: Secondary | ICD-10-CM | POA: Diagnosis not present

## 2023-12-07 DIAGNOSIS — F05 Delirium due to known physiological condition: Secondary | ICD-10-CM | POA: Diagnosis not present

## 2023-12-07 DIAGNOSIS — I480 Paroxysmal atrial fibrillation: Secondary | ICD-10-CM | POA: Diagnosis not present

## 2023-12-07 DIAGNOSIS — R531 Weakness: Secondary | ICD-10-CM | POA: Diagnosis not present

## 2023-12-07 DIAGNOSIS — E7800 Pure hypercholesterolemia, unspecified: Secondary | ICD-10-CM | POA: Diagnosis not present

## 2023-12-07 DIAGNOSIS — Z87438 Personal history of other diseases of male genital organs: Secondary | ICD-10-CM | POA: Diagnosis not present

## 2023-12-07 DIAGNOSIS — N400 Enlarged prostate without lower urinary tract symptoms: Secondary | ICD-10-CM | POA: Diagnosis not present

## 2023-12-07 DIAGNOSIS — Z7901 Long term (current) use of anticoagulants: Secondary | ICD-10-CM | POA: Diagnosis not present

## 2023-12-07 DIAGNOSIS — M5126 Other intervertebral disc displacement, lumbar region: Secondary | ICD-10-CM | POA: Diagnosis not present

## 2023-12-07 DIAGNOSIS — R627 Adult failure to thrive: Secondary | ICD-10-CM | POA: Diagnosis not present

## 2023-12-07 DIAGNOSIS — R7302 Impaired glucose tolerance (oral): Secondary | ICD-10-CM | POA: Diagnosis not present

## 2023-12-07 DIAGNOSIS — E440 Moderate protein-calorie malnutrition: Secondary | ICD-10-CM | POA: Diagnosis not present

## 2023-12-07 DIAGNOSIS — J810 Acute pulmonary edema: Secondary | ICD-10-CM | POA: Diagnosis not present

## 2023-12-07 DIAGNOSIS — F329 Major depressive disorder, single episode, unspecified: Secondary | ICD-10-CM | POA: Diagnosis not present

## 2023-12-07 DIAGNOSIS — F320 Major depressive disorder, single episode, mild: Secondary | ICD-10-CM | POA: Diagnosis not present

## 2023-12-07 DIAGNOSIS — F339 Major depressive disorder, recurrent, unspecified: Secondary | ICD-10-CM | POA: Diagnosis not present

## 2023-12-07 DIAGNOSIS — R4182 Altered mental status, unspecified: Secondary | ICD-10-CM | POA: Diagnosis not present

## 2023-12-07 DIAGNOSIS — I4819 Other persistent atrial fibrillation: Secondary | ICD-10-CM | POA: Diagnosis not present

## 2023-12-07 DIAGNOSIS — K5900 Constipation, unspecified: Secondary | ICD-10-CM | POA: Diagnosis not present

## 2023-12-08 DIAGNOSIS — I10 Essential (primary) hypertension: Secondary | ICD-10-CM | POA: Diagnosis not present

## 2023-12-08 DIAGNOSIS — F320 Major depressive disorder, single episode, mild: Secondary | ICD-10-CM | POA: Diagnosis not present

## 2023-12-08 DIAGNOSIS — R7302 Impaired glucose tolerance (oral): Secondary | ICD-10-CM | POA: Diagnosis not present

## 2023-12-08 DIAGNOSIS — E7800 Pure hypercholesterolemia, unspecified: Secondary | ICD-10-CM | POA: Diagnosis not present

## 2023-12-08 DIAGNOSIS — I480 Paroxysmal atrial fibrillation: Secondary | ICD-10-CM | POA: Diagnosis not present

## 2023-12-14 DIAGNOSIS — F05 Delirium due to known physiological condition: Secondary | ICD-10-CM | POA: Diagnosis not present

## 2023-12-14 DIAGNOSIS — I4891 Unspecified atrial fibrillation: Secondary | ICD-10-CM | POA: Diagnosis not present

## 2023-12-14 DIAGNOSIS — R634 Abnormal weight loss: Secondary | ICD-10-CM | POA: Diagnosis not present

## 2023-12-14 DIAGNOSIS — G3183 Dementia with Lewy bodies: Secondary | ICD-10-CM | POA: Diagnosis not present

## 2023-12-14 DIAGNOSIS — Z87438 Personal history of other diseases of male genital organs: Secondary | ICD-10-CM | POA: Diagnosis not present

## 2023-12-14 DIAGNOSIS — I10 Essential (primary) hypertension: Secondary | ICD-10-CM | POA: Diagnosis not present

## 2023-12-14 DIAGNOSIS — R4182 Altered mental status, unspecified: Secondary | ICD-10-CM | POA: Diagnosis not present

## 2023-12-14 DIAGNOSIS — E43 Unspecified severe protein-calorie malnutrition: Secondary | ICD-10-CM | POA: Diagnosis not present

## 2023-12-14 DIAGNOSIS — F419 Anxiety disorder, unspecified: Secondary | ICD-10-CM | POA: Diagnosis not present

## 2023-12-14 DIAGNOSIS — F0280 Dementia in other diseases classified elsewhere without behavioral disturbance: Secondary | ICD-10-CM | POA: Diagnosis not present

## 2023-12-14 DIAGNOSIS — R531 Weakness: Secondary | ICD-10-CM | POA: Diagnosis not present

## 2023-12-14 DIAGNOSIS — E440 Moderate protein-calorie malnutrition: Secondary | ICD-10-CM | POA: Diagnosis not present

## 2023-12-14 DIAGNOSIS — M5126 Other intervertebral disc displacement, lumbar region: Secondary | ICD-10-CM | POA: Diagnosis not present

## 2023-12-14 DIAGNOSIS — G9340 Encephalopathy, unspecified: Secondary | ICD-10-CM | POA: Diagnosis not present

## 2023-12-14 DIAGNOSIS — F02C4 Dementia in other diseases classified elsewhere, severe, with anxiety: Secondary | ICD-10-CM | POA: Diagnosis not present

## 2023-12-14 DIAGNOSIS — K5900 Constipation, unspecified: Secondary | ICD-10-CM | POA: Diagnosis not present

## 2023-12-14 DIAGNOSIS — I4819 Other persistent atrial fibrillation: Secondary | ICD-10-CM | POA: Diagnosis not present

## 2023-12-14 DIAGNOSIS — N400 Enlarged prostate without lower urinary tract symptoms: Secondary | ICD-10-CM | POA: Diagnosis not present

## 2023-12-14 DIAGNOSIS — Z66 Do not resuscitate: Secondary | ICD-10-CM | POA: Diagnosis not present

## 2023-12-14 DIAGNOSIS — F339 Major depressive disorder, recurrent, unspecified: Secondary | ICD-10-CM | POA: Diagnosis not present

## 2023-12-14 DIAGNOSIS — F329 Major depressive disorder, single episode, unspecified: Secondary | ICD-10-CM | POA: Diagnosis not present

## 2023-12-20 DIAGNOSIS — G3183 Dementia with Lewy bodies: Secondary | ICD-10-CM | POA: Diagnosis not present

## 2023-12-20 DIAGNOSIS — F02C4 Dementia in other diseases classified elsewhere, severe, with anxiety: Secondary | ICD-10-CM | POA: Diagnosis not present

## 2023-12-20 DIAGNOSIS — K5900 Constipation, unspecified: Secondary | ICD-10-CM | POA: Diagnosis not present

## 2023-12-20 DIAGNOSIS — E43 Unspecified severe protein-calorie malnutrition: Secondary | ICD-10-CM | POA: Diagnosis not present

## 2023-12-20 DIAGNOSIS — R531 Weakness: Secondary | ICD-10-CM | POA: Diagnosis not present

## 2023-12-27 DIAGNOSIS — F419 Anxiety disorder, unspecified: Secondary | ICD-10-CM | POA: Diagnosis not present

## 2023-12-27 DIAGNOSIS — E43 Unspecified severe protein-calorie malnutrition: Secondary | ICD-10-CM | POA: Diagnosis not present

## 2023-12-27 DIAGNOSIS — K5900 Constipation, unspecified: Secondary | ICD-10-CM | POA: Diagnosis not present

## 2023-12-27 DIAGNOSIS — F02C4 Dementia in other diseases classified elsewhere, severe, with anxiety: Secondary | ICD-10-CM | POA: Diagnosis not present

## 2023-12-27 DIAGNOSIS — G3183 Dementia with Lewy bodies: Secondary | ICD-10-CM | POA: Diagnosis not present

## 2023-12-28 DIAGNOSIS — R634 Abnormal weight loss: Secondary | ICD-10-CM | POA: Diagnosis not present

## 2023-12-28 DIAGNOSIS — K5900 Constipation, unspecified: Secondary | ICD-10-CM | POA: Diagnosis not present

## 2023-12-28 DIAGNOSIS — G3183 Dementia with Lewy bodies: Secondary | ICD-10-CM | POA: Diagnosis not present

## 2023-12-28 DIAGNOSIS — F02C4 Dementia in other diseases classified elsewhere, severe, with anxiety: Secondary | ICD-10-CM | POA: Diagnosis not present

## 2023-12-28 DIAGNOSIS — N400 Enlarged prostate without lower urinary tract symptoms: Secondary | ICD-10-CM | POA: Diagnosis not present

## 2023-12-29 DIAGNOSIS — I10 Essential (primary) hypertension: Secondary | ICD-10-CM | POA: Diagnosis not present

## 2023-12-29 DIAGNOSIS — E782 Mixed hyperlipidemia: Secondary | ICD-10-CM | POA: Diagnosis not present

## 2023-12-29 DIAGNOSIS — M47816 Spondylosis without myelopathy or radiculopathy, lumbar region: Secondary | ICD-10-CM | POA: Diagnosis not present

## 2023-12-29 DIAGNOSIS — F329 Major depressive disorder, single episode, unspecified: Secondary | ICD-10-CM | POA: Diagnosis not present

## 2023-12-29 DIAGNOSIS — F02C3 Dementia in other diseases classified elsewhere, severe, with mood disturbance: Secondary | ICD-10-CM | POA: Diagnosis not present

## 2023-12-29 DIAGNOSIS — F02C4 Dementia in other diseases classified elsewhere, severe, with anxiety: Secondary | ICD-10-CM | POA: Diagnosis not present

## 2023-12-29 DIAGNOSIS — M5127 Other intervertebral disc displacement, lumbosacral region: Secondary | ICD-10-CM | POA: Diagnosis not present

## 2023-12-29 DIAGNOSIS — M5416 Radiculopathy, lumbar region: Secondary | ICD-10-CM | POA: Diagnosis not present

## 2024-01-02 DIAGNOSIS — F02A0 Dementia in other diseases classified elsewhere, mild, without behavioral disturbance, psychotic disturbance, mood disturbance, and anxiety: Secondary | ICD-10-CM | POA: Diagnosis not present

## 2024-01-02 DIAGNOSIS — F321 Major depressive disorder, single episode, moderate: Secondary | ICD-10-CM | POA: Diagnosis not present

## 2024-01-02 DIAGNOSIS — R419 Unspecified symptoms and signs involving cognitive functions and awareness: Secondary | ICD-10-CM | POA: Diagnosis not present

## 2024-01-02 DIAGNOSIS — Z7901 Long term (current) use of anticoagulants: Secondary | ICD-10-CM | POA: Diagnosis not present

## 2024-01-02 DIAGNOSIS — G3183 Dementia with Lewy bodies: Secondary | ICD-10-CM | POA: Diagnosis not present

## 2024-01-05 DIAGNOSIS — G3183 Dementia with Lewy bodies: Secondary | ICD-10-CM | POA: Diagnosis not present

## 2024-01-05 DIAGNOSIS — E782 Mixed hyperlipidemia: Secondary | ICD-10-CM | POA: Diagnosis not present

## 2024-01-05 DIAGNOSIS — Z1331 Encounter for screening for depression: Secondary | ICD-10-CM | POA: Diagnosis not present

## 2024-01-05 DIAGNOSIS — F02C3 Dementia in other diseases classified elsewhere, severe, with mood disturbance: Secondary | ICD-10-CM | POA: Diagnosis not present

## 2024-01-05 DIAGNOSIS — I10 Essential (primary) hypertension: Secondary | ICD-10-CM | POA: Diagnosis not present

## 2024-01-05 DIAGNOSIS — M5416 Radiculopathy, lumbar region: Secondary | ICD-10-CM | POA: Diagnosis not present

## 2024-01-05 DIAGNOSIS — E440 Moderate protein-calorie malnutrition: Secondary | ICD-10-CM | POA: Diagnosis not present

## 2024-01-05 DIAGNOSIS — F0283 Dementia in other diseases classified elsewhere, unspecified severity, with mood disturbance: Secondary | ICD-10-CM | POA: Diagnosis not present

## 2024-01-05 DIAGNOSIS — M5127 Other intervertebral disc displacement, lumbosacral region: Secondary | ICD-10-CM | POA: Diagnosis not present

## 2024-01-05 DIAGNOSIS — F329 Major depressive disorder, single episode, unspecified: Secondary | ICD-10-CM | POA: Diagnosis not present

## 2024-01-05 DIAGNOSIS — I480 Paroxysmal atrial fibrillation: Secondary | ICD-10-CM | POA: Diagnosis not present

## 2024-01-05 DIAGNOSIS — E538 Deficiency of other specified B group vitamins: Secondary | ICD-10-CM | POA: Diagnosis not present

## 2024-01-05 DIAGNOSIS — F0390 Unspecified dementia without behavioral disturbance: Secondary | ICD-10-CM | POA: Diagnosis not present

## 2024-01-05 DIAGNOSIS — F02C4 Dementia in other diseases classified elsewhere, severe, with anxiety: Secondary | ICD-10-CM | POA: Diagnosis not present

## 2024-01-05 DIAGNOSIS — M47816 Spondylosis without myelopathy or radiculopathy, lumbar region: Secondary | ICD-10-CM | POA: Diagnosis not present

## 2024-01-10 DIAGNOSIS — F329 Major depressive disorder, single episode, unspecified: Secondary | ICD-10-CM | POA: Diagnosis not present

## 2024-01-17 DIAGNOSIS — G3183 Dementia with Lewy bodies: Secondary | ICD-10-CM | POA: Diagnosis not present

## 2024-01-17 DIAGNOSIS — F329 Major depressive disorder, single episode, unspecified: Secondary | ICD-10-CM | POA: Diagnosis not present

## 2024-01-17 DIAGNOSIS — M47816 Spondylosis without myelopathy or radiculopathy, lumbar region: Secondary | ICD-10-CM | POA: Diagnosis not present

## 2024-01-17 DIAGNOSIS — F02C3 Dementia in other diseases classified elsewhere, severe, with mood disturbance: Secondary | ICD-10-CM | POA: Diagnosis not present

## 2024-01-17 DIAGNOSIS — M5127 Other intervertebral disc displacement, lumbosacral region: Secondary | ICD-10-CM | POA: Diagnosis not present

## 2024-01-17 DIAGNOSIS — E782 Mixed hyperlipidemia: Secondary | ICD-10-CM | POA: Diagnosis not present

## 2024-01-17 DIAGNOSIS — I10 Essential (primary) hypertension: Secondary | ICD-10-CM | POA: Diagnosis not present

## 2024-01-17 DIAGNOSIS — M5416 Radiculopathy, lumbar region: Secondary | ICD-10-CM | POA: Diagnosis not present

## 2024-01-19 DIAGNOSIS — F02C4 Dementia in other diseases classified elsewhere, severe, with anxiety: Secondary | ICD-10-CM | POA: Diagnosis not present

## 2024-01-19 DIAGNOSIS — F329 Major depressive disorder, single episode, unspecified: Secondary | ICD-10-CM | POA: Diagnosis not present

## 2024-01-19 DIAGNOSIS — M47816 Spondylosis without myelopathy or radiculopathy, lumbar region: Secondary | ICD-10-CM | POA: Diagnosis not present

## 2024-01-19 DIAGNOSIS — M5127 Other intervertebral disc displacement, lumbosacral region: Secondary | ICD-10-CM | POA: Diagnosis not present

## 2024-01-19 DIAGNOSIS — G3183 Dementia with Lewy bodies: Secondary | ICD-10-CM | POA: Diagnosis not present

## 2024-01-19 DIAGNOSIS — F02C3 Dementia in other diseases classified elsewhere, severe, with mood disturbance: Secondary | ICD-10-CM | POA: Diagnosis not present

## 2024-01-19 DIAGNOSIS — I10 Essential (primary) hypertension: Secondary | ICD-10-CM | POA: Diagnosis not present

## 2024-01-19 DIAGNOSIS — M5416 Radiculopathy, lumbar region: Secondary | ICD-10-CM | POA: Diagnosis not present

## 2024-01-19 DIAGNOSIS — E782 Mixed hyperlipidemia: Secondary | ICD-10-CM | POA: Diagnosis not present

## 2024-01-25 DIAGNOSIS — M5416 Radiculopathy, lumbar region: Secondary | ICD-10-CM | POA: Diagnosis not present

## 2024-02-06 DIAGNOSIS — N3941 Urge incontinence: Secondary | ICD-10-CM | POA: Diagnosis not present

## 2024-02-06 DIAGNOSIS — F329 Major depressive disorder, single episode, unspecified: Secondary | ICD-10-CM | POA: Diagnosis not present

## 2024-02-06 DIAGNOSIS — R419 Unspecified symptoms and signs involving cognitive functions and awareness: Secondary | ICD-10-CM | POA: Diagnosis not present
